# Patient Record
Sex: Female | Born: 1980 | Race: White | Hispanic: No | Marital: Single | Smoking: Never smoker
Health system: Southern US, Community
[De-identification: ages and names within clinical notes are randomized; demographics above are authoritative.]

## PROBLEM LIST (undated history)

## (undated) ENCOUNTER — Ambulatory Visit (HOSPITAL_COMMUNITY): Admission: EM | Payer: Self-pay

## (undated) DIAGNOSIS — N12 Tubulo-interstitial nephritis, not specified as acute or chronic: Secondary | ICD-10-CM

## (undated) DIAGNOSIS — F419 Anxiety disorder, unspecified: Secondary | ICD-10-CM

## (undated) DIAGNOSIS — J45909 Unspecified asthma, uncomplicated: Secondary | ICD-10-CM

## (undated) DIAGNOSIS — D259 Leiomyoma of uterus, unspecified: Secondary | ICD-10-CM

## (undated) HISTORY — DX: Tubulo-interstitial nephritis, not specified as acute or chronic: N12

## (undated) HISTORY — DX: Unspecified asthma, uncomplicated: J45.909

## (undated) HISTORY — DX: Leiomyoma of uterus, unspecified: D25.9

## (undated) HISTORY — DX: Anxiety disorder, unspecified: F41.9

## (undated) HISTORY — PX: WISDOM TOOTH EXTRACTION: SHX21

---

## 2019-04-02 LAB — RESULTS CONSOLE HPV: CHL HPV: NEGATIVE

## 2019-04-02 LAB — HM PAP SMEAR: HM Pap smear: NORMAL

## 2021-07-10 ENCOUNTER — Other Ambulatory Visit: Payer: Self-pay

## 2021-07-14 ENCOUNTER — Emergency Department (HOSPITAL_COMMUNITY)
Admission: EM | Admit: 2021-07-14 | Discharge: 2021-07-14 | Disposition: A | Discharge status: Home / Self Care | Payer: No Typology Code available for payment source | Attending: Emergency Medicine | Admitting: Emergency Medicine

## 2021-07-14 ENCOUNTER — Other Ambulatory Visit: Payer: Self-pay

## 2021-07-14 ENCOUNTER — Emergency Department (HOSPITAL_COMMUNITY): Payer: No Typology Code available for payment source

## 2021-07-14 ENCOUNTER — Encounter (HOSPITAL_COMMUNITY): Payer: Self-pay

## 2021-07-14 DIAGNOSIS — M549 Dorsalgia, unspecified: Secondary | ICD-10-CM | POA: Insufficient documentation

## 2021-07-14 DIAGNOSIS — N12 Tubulo-interstitial nephritis, not specified as acute or chronic: Secondary | ICD-10-CM

## 2021-07-14 DIAGNOSIS — R109 Unspecified abdominal pain: Secondary | ICD-10-CM | POA: Insufficient documentation

## 2021-07-14 DIAGNOSIS — Z20822 Contact with and (suspected) exposure to covid-19: Secondary | ICD-10-CM | POA: Diagnosis not present

## 2021-07-14 DIAGNOSIS — R509 Fever, unspecified: Secondary | ICD-10-CM | POA: Diagnosis not present

## 2021-07-14 LAB — URINALYSIS, ROUTINE W REFLEX MICROSCOPIC
Bilirubin Urine: NEGATIVE
Glucose, UA: NEGATIVE mg/dL
Hgb urine dipstick: NEGATIVE
Ketones, ur: NEGATIVE mg/dL
Leukocytes,Ua: NEGATIVE
Nitrite: NEGATIVE
Protein, ur: NEGATIVE mg/dL
Specific Gravity, Urine: 1.001 — ABNORMAL LOW (ref 1.005–1.030)
pH: 6 (ref 5.0–8.0)

## 2021-07-14 LAB — COMPREHENSIVE METABOLIC PANEL
ALT: 12 U/L (ref 0–44)
AST: 12 U/L — ABNORMAL LOW (ref 15–41)
Albumin: 3.5 g/dL (ref 3.5–5.0)
Alkaline Phosphatase: 52 U/L (ref 38–126)
Anion gap: 8 (ref 5–15)
BUN: 11 mg/dL (ref 6–20)
CO2: 25 mmol/L (ref 22–32)
Calcium: 9.1 mg/dL (ref 8.9–10.3)
Chloride: 102 mmol/L (ref 98–111)
Creatinine, Ser: 0.77 mg/dL (ref 0.44–1.00)
GFR, Estimated: >60 mL/min (ref >60)
Glucose, Bld: 83 mg/dL (ref 70–99)
Potassium: 3.7 mmol/L (ref 3.5–5.1)
Sodium: 135 mmol/L (ref 135–145)
Total Bilirubin: 0.5 mg/dL (ref 0.3–1.2)
Total Protein: 7.5 g/dL (ref 6.5–8.1)

## 2021-07-14 LAB — CBC WITH DIFFERENTIAL/PLATELET
Abs Immature Granulocytes: 0.09 10*3/uL — ABNORMAL HIGH (ref 0.00–0.07)
Basophils Absolute: 0.1 10*3/uL (ref 0.0–0.1)
Basophils Relative: 1 %
Eosinophils Absolute: 0.2 10*3/uL (ref 0.0–0.5)
Eosinophils Relative: 2 %
HCT: 31.6 % — ABNORMAL LOW (ref 36.0–46.0)
Hemoglobin: 9.9 g/dL — ABNORMAL LOW (ref 12.0–15.0)
Immature Granulocytes: 1 %
Lymphocytes Relative: 22 %
Lymphs Abs: 1.8 10*3/uL (ref 0.7–4.0)
MCH: 26.5 pg (ref 26.0–34.0)
MCHC: 31.3 g/dL (ref 30.0–36.0)
MCV: 84.5 fL (ref 80.0–100.0)
Monocytes Absolute: 0.7 10*3/uL (ref 0.1–1.0)
Monocytes Relative: 9 %
Neutro Abs: 5.4 10*3/uL (ref 1.7–7.7)
Neutrophils Relative %: 65 %
Platelets: 300 10*3/uL (ref 150–400)
RBC: 3.74 MIL/uL — ABNORMAL LOW (ref 3.87–5.11)
RDW: 15.7 % — ABNORMAL HIGH (ref 11.5–15.5)
WBC: 8.2 10*3/uL (ref 4.0–10.5)
nRBC: 0 % (ref 0.0–0.2)

## 2021-07-14 LAB — LACTIC ACID, PLASMA
Lactic Acid, Venous: 0.4 mmol/L — ABNORMAL LOW (ref 0.5–1.9)
Lactic Acid, Venous: 0.5 mmol/L (ref 0.5–1.9)

## 2021-07-14 LAB — RESP PANEL BY RT-PCR (FLU A&B, COVID) ARPGX2
Influenza A by PCR: NEGATIVE
Influenza B by PCR: NEGATIVE
SARS Coronavirus 2 by RT PCR: NEGATIVE

## 2021-07-14 LAB — I-STAT BETA HCG BLOOD, ED (MC, WL, AP ONLY): I-stat hCG, quantitative: <5 m[IU]/mL (ref <5)

## 2021-07-14 MED ORDER — CEPHALEXIN 500 MG PO CAPS: 500.0000 mg | ORAL_CAPSULE | Freq: Three times a day (TID) | ORAL | 0 refills | Status: DC

## 2021-07-14 MED ORDER — CEPHALEXIN 500 MG PO CAPS: 500.0000 mg | ORAL_CAPSULE | Freq: Three times a day (TID) | ORAL | 0 refills | Status: AC

## 2021-07-14 MED ORDER — SODIUM CHLORIDE 0.9 % IV SOLN
1.0000 g | Freq: Once | INTRAVENOUS | Status: AC
  Administered 2021-07-14: 1 g via INTRAVENOUS
  Filled 2021-07-14: qty 10

## 2021-07-14 MED ORDER — IOHEXOL 350 MG/ML SOLN
80.0000 mL | Freq: Once | INTRAVENOUS | Status: AC | PRN
  Administered 2021-07-14: 80 mL via INTRAVENOUS

## 2021-07-14 MED ORDER — HYDROCODONE-ACETAMINOPHEN 5-325 MG PO TABS: 1.0000 | ORAL_TABLET | Freq: Four times a day (QID) | ORAL | 0 refills | Status: DC | PRN

## 2021-07-14 MED ORDER — SODIUM CHLORIDE 0.9 % IV BOLUS
1000.0000 mL | Freq: Once | INTRAVENOUS | Status: AC
  Administered 2021-07-14: 1000 mL via INTRAVENOUS

## 2021-07-14 MED ORDER — MORPHINE SULFATE (PF) 4 MG/ML IV SOLN
4.0000 mg | Freq: Once | INTRAVENOUS | Status: AC
  Administered 2021-07-14: 4 mg via INTRAVENOUS
  Filled 2021-07-14: qty 1

## 2021-07-14 NOTE — ED Provider Notes (Signed)
[place] DEPT Provider Note   CSN: [id number] Arrival date & time: 07/14/21  1635     History Chief Complaint  Patient presents with   Fever   Flank Pain   Back Pain    Erica Chavez is a 40 y.o. female here presenting with right flank pain, chills, back pain.  Patient states that she woke up 4 days ago and started having right flank pain.  She states that she also started having fevers.  She states that since then she has been having fever with T-max 101 every day.  She had a home negative COVID test.  She then went to urgent care and had a negative flu and COVID test as well.  She states that she is running persistent fevers that were improved with Excedrin.  She denies any cough.  Denies any sick contacts.  The history is provided by the patient.      History reviewed. No pertinent past medical history.  There are no problems to display for this patient.    OB History   No obstetric history on file.     No family history on file.  Social History   Tobacco Use   Smoking status: Never   Smokeless tobacco: Never  Substance Use Topics   Alcohol use: Yes    Comment: occassional   Drug use: Never    Home Medications Prior to Admission medications   Not on File    Allergies    Patient has no known allergies.  Review of Systems   Review of Systems  Constitutional:  Positive for fever.  Genitourinary:  Positive for flank pain.  Musculoskeletal:  Positive for back pain.  All other systems reviewed and are negative.  Physical Exam Updated Vital Signs BP 116/82   Pulse 76   Temp 97.8 F (36.6 C) (Oral)   Resp 16   Ht 5\' 9"  (1.753 m)   Wt 61.7 kg   LMP 06/25/2021   SpO2 99%   BMI 20.08 kg/m   Physical Exam Vitals and nursing note reviewed.  HENT:     Head: Normocephalic.     Nose: Nose normal.     Mouth/Throat:     Mouth: Mucous membranes are moist.  Eyes:     Extraocular Movements: Extraocular movements  intact.     Pupils: Pupils are equal, round, and reactive to light.  Cardiovascular:     Rate and Rhythm: Normal rate and regular rhythm.     Pulses: Normal pulses.     Heart sounds: Normal heart sounds.  Pulmonary:     Effort: Pulmonary effort is normal.     Breath sounds: Normal breath sounds.  Abdominal:     General: Abdomen is flat.     Comments: Right CVA tenderness  Musculoskeletal:        General: Normal range of motion.     Cervical back: Normal range of motion and neck supple.  Skin:    General: Skin is warm.     Capillary Refill: Capillary refill takes less than 2 seconds.  Neurological:     General: No focal deficit present.     Mental Status: She is alert and oriented to person, place, and time.  Psychiatric:        Mood and Affect: Mood normal.        Behavior: Behavior normal.    ED Results / Procedures / Treatments   Labs (all labs ordered are listed, but only abnormal results  are displayed) Labs Reviewed  COMPREHENSIVE METABOLIC PANEL - Abnormal; Notable for the following components:      Result Value   AST 12 (*)    All other components within normal limits  CBC WITH DIFFERENTIAL/PLATELET - Abnormal; Notable for the following components:   RBC 3.74 (*)    Hemoglobin 9.9 (*)    HCT 31.6 (*)    RDW 15.7 (*)    Abs Immature Granulocytes 0.09 (*)    All other components within normal limits  URINALYSIS, ROUTINE W REFLEX MICROSCOPIC - Abnormal; Notable for the following components:   Color, Urine COLORLESS (*)    Specific Gravity, Urine 1.001 (*)    All other components within normal limits  LACTIC ACID, PLASMA - Abnormal; Notable for the following components:   Lactic Acid, Venous 0.4 (*)    All other components within normal limits  RESP PANEL BY RT-PCR (FLU A&B, COVID) ARPGX2  URINE CULTURE  CULTURE, BLOOD (ROUTINE X 2)  CULTURE, BLOOD (ROUTINE X 2)  LACTIC ACID, PLASMA  I-STAT BETA HCG BLOOD, ED ([facility], [facility], [facility] ONLY)    EKG None  Radiology DG  Chest 2 View  Result Date: 07/14/2021 CLINICAL DATA:  Fever EXAM: CHEST - 2 VIEW COMPARISON:  None. FINDINGS: The heart size and mediastinal contours are within normal limits. Both lungs are clear. The visualized skeletal structures are unremarkable. IMPRESSION: No active cardiopulmonary disease. Electronically Signed   By: Fidela Salisbury M.D.   On: 07/14/2021 19:46   CT ABDOMEN PELVIS W CONTRAST  Result Date: 07/14/2021 CLINICAL DATA:  Abdominal pain, fever, right flank pain EXAM: CT ABDOMEN AND PELVIS WITH CONTRAST TECHNIQUE: Multidetector CT imaging of the abdomen and pelvis was performed using the standard protocol following bolus administration of intravenous contrast. CONTRAST:  76mL OMNIPAQUE IOHEXOL 350 MG/ML SOLN COMPARISON:  None. FINDINGS: Lower chest: Trace bilateral pleural fluid. No acute airspace disease. Hepatobiliary: No focal liver abnormality is seen. No gallstones, gallbladder wall thickening, or biliary dilatation. Pancreas: Unremarkable. No pancreatic ductal dilatation or surrounding inflammatory changes. Spleen: Normal in size without focal abnormality. Adrenals/Urinary Tract: The right kidney is enlarged and edematous, with heterogeneous enhancement compatible with acute pyelonephritis. No evidence of renal abscess. The left kidney enhances normally. 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 The bladder is unremarkable.  The adrenals are normal. Stomach/Bowel: No bowel obstruction or ileus. The appendix, if still present, is not well visualized. No bowel wall thickening or inflammatory change. Vascular/Lymphatic: No significant vascular findings are present. No enlarged abdominal or pelvic lymph nodes. Reproductive: There is a 7.3 x 5.9 by 7.2 cm intramural mass within the left lateral aspect of the lower uterine segment, consistent with a large intramural fibroid. There are no adnexal masses. Other: There is a small amount of free fluid within the lower pelvis,  more than is typically seen for physiologic fluid. This may be reactive Chavez to the inflammatory changes in the right kidney described above. No free intraperitoneal gas. No abdominal wall hernia. Musculoskeletal: No acute or destructive bony lesions. Reconstructed images demonstrate no additional findings. IMPRESSION: 1. Right-sided pyelonephritis.  No evidence of renal abscess. 2. Large intramural fibroid within the lower uterine segment, measuring up to 7.3 cm in size. 3. Small volume free fluid within the lower pelvis, likely reactive. 4. Trace bilateral pleural effusions. Electronically Signed   By: Randa Ngo M.D.   On: 07/14/2021 20:51    Procedures Procedures   Medications Ordered in ED Medications  sodium chloride 0.9 % bolus 1,000 mL (0 mLs Intravenous Stopped 07/14/21 2130)  iohexol (OMNIPAQUE) 350 MG/ML injection 80 mL (80 mLs Intravenous Contrast Given 07/14/21 1942)  cefTRIAXone (ROCEPHIN) 1 g in sodium chloride 0.9 % 100 mL IVPB (0 g Intravenous Stopped 07/14/21 2204)  morphine 4 MG/ML injection 4 mg (4 mg Intravenous Given 07/14/21 2116)    ED Course  I have reviewed the triage vital signs and the nursing notes.  Pertinent labs & imaging results that were available during my care of the patient were reviewed by me and considered in my medical decision making (see chart for details).    MDM Rules/Calculators/A&P                           Erica Chavez is a 40 y.o. female here presenting with fevers for 4 days.  He also has right flank pain as well.  Consider infected kidney stone versus pyelonephritis versus pneumonia.  We will get CBC and CMP and lactate and CT abdomen pelvis and chest x-ray and repeat COVID and flu test.   10:09 PM White blood cell count is normal.  Lactate is normal.  Patient does have right-sided pyelonephritis.  There is no evidence of renal abscess.  She is not septic right now and her urinalysis is normal. She asked me about why she has  pyelonephritis.  I told her that it may be from her fibroid.  There is no obvious obstructing stone.  We will give a dose of Rocephin and discharged home with Keflex.  Told her that she may benefit from seeing urology  Final Clinical Impression(s) / ED Diagnoses Final diagnoses:  None    Rx / DC Orders ED Discharge Orders     None        Drenda Freeze, MD 07/14/21 2211

## 2021-07-14 NOTE — ED Provider Notes (Signed)
Emergency Medicine Provider Triage Evaluation Note  Erica Chavez , a 40 y.o. female  was evaluated in triage.  Pt complains of fevers, chills, body aches and right sided flank pain since Tuesday.    No cough or headache.  No sore throat.  No urinary symptoms. No discharge, no prior abdominal surgeries. On Monday she vomited, and felt better until Tuesday PM.  She didn't have any diarrhea.  She hasn't had any coffee during this time.  She has had temps up to 101.3.    Review of Systems  Positive: Right sided flank pain, fevers Negative: Dysuria, frequency or urgency.   Physical Exam  BP 103/65 (BP Location: Left Arm)   Pulse 73   Temp 97.8 F (36.6 C) (Oral)   Resp 19   Ht 5\' 9"  (1.753 m)   Wt 61.7 kg   SpO2 99%   BMI 20.08 kg/m  Gen:   Awake, no distress   Resp:  Normal effort  MSK:   Moves extremities without difficulty  Other:  ABD is not tender, right sided CVA tenderness to percussion.   Medical Decision Making  Medically screening exam initiated at 4:53 PM.  Appropriate orders placed.  Jisell Majer was informed that the remainder of the evaluation will be completed by another provider, this initial triage assessment does not replace that evaluation, and the importance of remaining in the ED until their evaluation is complete.      Lorin Glass, PA-C 07/14/21 1709    Carmin Muskrat, MD 07/14/21 2102

## 2021-07-14 NOTE — ED Triage Notes (Signed)
Pt c/o fever, chills, and body aches since Tuesday. Pt was seen at urgent care and tested negative for the flu. Pt was sent here for further workup due to pain in her right lower back that radiates to the front.

## 2021-07-14 NOTE — Discharge Instructions (Addendum)
You have a kidney infection.  Take Keflex 3 times daily for 10 days   Take tylenol, motrin for fever. Take vicodin for severe pain   You may benefit from seeing a urology  Return to ER if you have persistent fever, flank pain, vomiting

## 2021-07-16 LAB — URINE CULTURE: Culture: 40000 — AB

## 2021-07-17 ENCOUNTER — Telehealth: Payer: Self-pay | Admitting: Emergency Medicine

## 2021-07-17 NOTE — Telephone Encounter (Signed)
Post ED Visit - Positive Culture Follow-up  Culture report reviewed by antimicrobial stewardship pharmacist: [facility] Team []  Elenor Quinones, Pharm.D. [x]  Heide Guile, Pharm.D., BCPS AQ-ID []  Parks Neptune, Pharm.D., BCPS []  Alycia Rossetti, Pharm.D., BCPS []  [place], Pharm.D., BCPS, AAHIVP []  Legrand Como, Pharm.D., BCPS, AAHIVP []  Salome Arnt, PharmD, BCPS []  Johnnette Gourd, PharmD, BCPS []  Hughes Better, PharmD, BCPS []  Leeroy Cha, PharmD []  Laqueta Linden, PharmD, BCPS []  Albertina Parr, PharmD  [place] Team []  Leodis Sias, PharmD []  Lindell Spar, PharmD []  Royetta Asal, PharmD []  Graylin Shiver, Rph []  Rema Fendt) Glennon Mac, PharmD []  Arlyn Dunning, PharmD []  Netta Cedars, PharmD []  Dia Sitter, PharmD []  Leone Haven, PharmD []  Gretta Arab, PharmD []  Theodis Shove, PharmD []  Peggyann Juba, PharmD []  Reuel Boom, PharmD   Positive urine culture Treated with cephalexin, organism sensitive to the same and no further patient follow-up is required at this time.  Hazle Nordmann 07/17/2021, 9:28 AM

## 2021-07-20 LAB — CULTURE, BLOOD (ROUTINE X 2)
Culture: NO GROWTH
Culture: NO GROWTH

## 2022-04-04 ENCOUNTER — Encounter: Payer: Self-pay | Admitting: Nurse Practitioner

## 2022-04-04 ENCOUNTER — Ambulatory Visit (INDEPENDENT_AMBULATORY_CARE_PROVIDER_SITE_OTHER): Payer: 59 | Admitting: Nurse Practitioner

## 2022-04-04 VITALS — BP 108/74 | HR 83 | Temp 97.1°F | Wt 123.0 lb

## 2022-04-04 DIAGNOSIS — D259 Leiomyoma of uterus, unspecified: Secondary | ICD-10-CM

## 2022-04-04 DIAGNOSIS — Z87448 Personal history of other diseases of urinary system: Secondary | ICD-10-CM | POA: Diagnosis not present

## 2022-04-04 DIAGNOSIS — Z1283 Encounter for screening for malignant neoplasm of skin: Secondary | ICD-10-CM

## 2022-04-04 DIAGNOSIS — J4520 Mild intermittent asthma, uncomplicated: Secondary | ICD-10-CM | POA: Diagnosis not present

## 2022-04-04 DIAGNOSIS — F418 Other specified anxiety disorders: Secondary | ICD-10-CM | POA: Diagnosis not present

## 2022-04-04 MED ORDER — ALBUTEROL SULFATE HFA 108 (90 BASE) MCG/ACT IN AERS: 2.0000 | INHALATION_SPRAY | Freq: Four times a day (QID) | RESPIRATORY_TRACT | 6 refills | Status: AC | PRN

## 2022-04-04 MED ORDER — ALPRAZOLAM 0.25 MG PO TABS: 0.2500 mg | ORAL_TABLET | Freq: Two times a day (BID) | ORAL | 0 refills | Status: AC | PRN

## 2022-04-04 NOTE — Progress Notes (Signed)
New Patient Visit  BP 108/74   Pulse 83   Temp (!) 97.1 F (36.2 C) (Temporal)   Wt 123 lb (55.8 kg)   SpO2 99%   BMI 18.16 kg/m    Subjective:    Patient ID: Erica Chavez, female    DOB: March 04, 1981, 41 y.o.   MRN: [id number]  CC: Chief Complaint  Patient presents with   Establish Care    Np. Est care. Overall health assessment. Pt states she is experiencing high levels of stress due to work.     HPI: Erica Chavez is a 41 y.o. female presents for new patient visit to establish care.  Introduced to Designer, jewellery role and practice setting.  All questions answered.  Discussed provider/patient relationship and expectations.  Chinara has a recent history of pyelonephritis.  She states that she did not have any urinary tract symptoms before she developed severe flank pain.  She states that she needs to drink a lot of water frequently reports she will feel a tugging in her vaginal area.  She was told to follow-up with urology she has not done this, however she would like a referral today.  Currently she is not having any urinary symptoms including dysuria, urinary frequency, urgency.  She has a history of intermittent asthma she uses an albuterol inhaler as needed.  She states that she does not need to use this frequently her symptoms are overall well controlled.  She states that she has been under a lot of stress recently along with situational anxiety.  She has tried using the calm app, the better help app, and doing meditation.  She states that she has lost weight recently without trying.  She has noticed tingling in her hands and her fingers that will radiate up her arms.  This is typically a sign of an upcoming panic attack.  She is interested in starting therapy and possibly medication to take as needed.  She denies SI/HI.  She has taken Tylenol PM to help her sleep at home.     04/04/2022   12:01 PM  Depression screen PHQ 2/9  Down, Depressed, Hopeless 2  PHQ - 2 Score  2  Altered sleeping 3  Tired, decreased energy 2  Change in appetite 2  Feeling bad or failure about yourself  2  Trouble concentrating 2  Moving slowly or fidgety/restless 3  Suicidal thoughts 0  PHQ-9 Score 16  Difficult doing work/chores Very difficult      04/04/2022   12:01 PM  GAD 7 : Generalized Anxiety Score  Nervous, Anxious, on Edge 3  Control/stop worrying 3  Worry too much - different things 3  Trouble relaxing 3  Restless 3  Easily annoyed or irritable 3  Afraid - awful might happen 2  Total GAD 7 Score 20  Anxiety Difficulty Very difficult    Past Medical History:  Diagnosis Date   Anxiety    Asthma    Pyelonephritis    Uterine fibroid     Past Surgical History:  Procedure Laterality Date   WISDOM TOOTH EXTRACTION      Family History  Problem Relation Age of Onset   Cancer Maternal Aunt        ovarian   Cancer Maternal Grandmother        lung   Kidney disease Paternal Grandfather      Social History   Tobacco Use   Smoking status: Never   Smokeless tobacco: Never  Vaping Use   Vaping  Use: Never used  Substance Use Topics   Alcohol use: Yes    Comment: occassional   Drug use: Never    No current outpatient medications on file prior to visit.   No current facility-administered medications on file prior to visit.     Review of Systems  Constitutional:  Positive for fatigue. Negative for fever.  HENT: Negative.    Respiratory: Negative.    Cardiovascular: Negative.   Gastrointestinal: Negative.   Genitourinary: Negative.   Musculoskeletal: Negative.   Neurological: Negative.   Psychiatric/Behavioral:  Positive for sleep disturbance. The patient is nervous/anxious.         Objective:    BP 108/74   Pulse 83   Temp (!) 97.1 F (36.2 C) (Temporal)   Wt 123 lb (55.8 kg)   SpO2 99%   BMI 18.16 kg/m   Wt Readings from Last 3 Encounters:  04/04/22 123 lb (55.8 kg)  07/14/21 136 lb (61.7 kg)    BP Readings from Last 3  Encounters:  04/04/22 108/74  07/14/21 116/82    Physical Exam Vitals and nursing note reviewed.  Constitutional:      General: She is not in acute distress.    Appearance: Normal appearance.  HENT:     Head: Normocephalic.  Eyes:     Conjunctiva/sclera: Conjunctivae normal.  Cardiovascular:     Rate and Rhythm: Normal rate and regular rhythm.     Pulses: Normal pulses.     Heart sounds: Normal heart sounds.  Pulmonary:     Effort: Pulmonary effort is normal.     Breath sounds: Normal breath sounds.  Abdominal:     Palpations: Abdomen is soft.     Tenderness: There is no abdominal tenderness.  Musculoskeletal:     Cervical back: Normal range of motion and neck supple. No tenderness.  Lymphadenopathy:     Cervical: No cervical adenopathy.  Skin:    General: Skin is warm.  Neurological:     General: No focal deficit present.     Mental Status: She is alert and oriented to person, place, and time.  Psychiatric:        Mood and Affect: Mood normal.        Behavior: Behavior normal.        Thought Content: Thought content normal.        Judgment: Judgment normal.        Assessment & Plan:   Problem List Items Addressed This Visit       Respiratory   Mild intermittent asthma without complication    She has a history of mild intermittent asthma which is well controlled.  We will refill her albuterol inhaler to use as needed.  Follow-up if she is needing to use her inhaler more frequently.      Relevant Medications   albuterol (VENTOLIN HFA) 108 (90 Base) MCG/ACT inhaler     Genitourinary   Uterine leiomyoma    She had a recent CT of her abdomen and pelvis done which showed a intramural uterine fibroid that was 7.3 x 5.9 x 7.2 cm.  Referral placed to GYN.  She does endorse intermittent menstrual periods that are heavy in flow.        Other   Situational anxiety - Primary    She has been having an increased amount of stress at work which causes her to have trouble  sleeping.  She has been using various apps to help with her anxiety which helps a little  bit, however it is still ongoing.  Her PHQ-9 is a 16 and her GAD-7 is a 20.  She denies SI/HI.  We will place a referral for therapy and start Xanax 0.25 mg twice a day as needed.  Discussed possible side effects and told her not to take this and drive.  PDMP reviewed.  Follow-up in 2 to 3 months.      Relevant Medications   ALPRAZolam (XANAX) 0.25 MG tablet   Other Relevant Orders   Ambulatory referral to Psychology   Other Visit Diagnoses     History of pyelonephritis       She has a history of pyelonephritis and was told to follow-up with urology.  Referral to urology placed.   Relevant Orders   Ambulatory referral to Urology   Screening exam for skin cancer       Referral placed to dermatology per patient request.   Relevant Orders   Ambulatory referral to Dermatology        Follow up plan: Return in about 4 weeks (around 05/02/2022) for 1-2 months, CPE.

## 2022-04-04 NOTE — Assessment & Plan Note (Signed)
She had a recent CT of her abdomen and pelvis done which showed a intramural uterine fibroid that was 7.3 x 5.9 x 7.2 cm.  Referral placed to GYN.  She does endorse intermittent menstrual periods that are heavy in flow.

## 2022-04-04 NOTE — Assessment & Plan Note (Signed)
She has been having an increased amount of stress at work which causes her to have trouble sleeping.  She has been using various apps to help with her anxiety which helps a little bit, however it is still ongoing.  Her PHQ-9 is a 16 and her GAD-7 is a 20.  She denies SI/HI.  We will place a referral for therapy and start Xanax 0.25 mg twice a day as needed.  Discussed possible side effects and told her not to take this and drive.  PDMP reviewed.  Follow-up in 2 to 3 months.

## 2022-04-04 NOTE — Patient Instructions (Signed)
It was great to see you!  You can start xanax as needed for panic attack. This may make you sleepy, do not drive.   I have placed a referral to urology, GYN, and dermatology.   Let's follow-up in 1-2 months, sooner if you have concerns.  If a referral was placed today, you will be contacted for an appointment. Please note that routine referrals can sometimes take up to 3-4 weeks to process. Please call our office if you haven't heard anything after this time frame.  Take care,  Vance Peper, NP

## 2022-04-04 NOTE — Assessment & Plan Note (Signed)
She has a history of mild intermittent asthma which is well controlled.  We will refill her albuterol inhaler to use as needed.  Follow-up if she is needing to use her inhaler more frequently.

## 2022-04-18 ENCOUNTER — Other Ambulatory Visit: Payer: Self-pay | Admitting: Nurse Practitioner

## 2022-04-18 DIAGNOSIS — D259 Leiomyoma of uterus, unspecified: Secondary | ICD-10-CM

## 2022-05-08 ENCOUNTER — Encounter: Payer: Self-pay | Admitting: Nurse Practitioner

## 2022-05-27 ENCOUNTER — Encounter: Payer: No Typology Code available for payment source | Admitting: Family Medicine

## 2023-08-06 IMAGING — CR DG CHEST 2V
2 series · 2 of 2 positions shown · non-contrast
Comparison: None.

CLINICAL DATA: Fever

EXAM:
CHEST - 2 VIEW

[w chest pa]
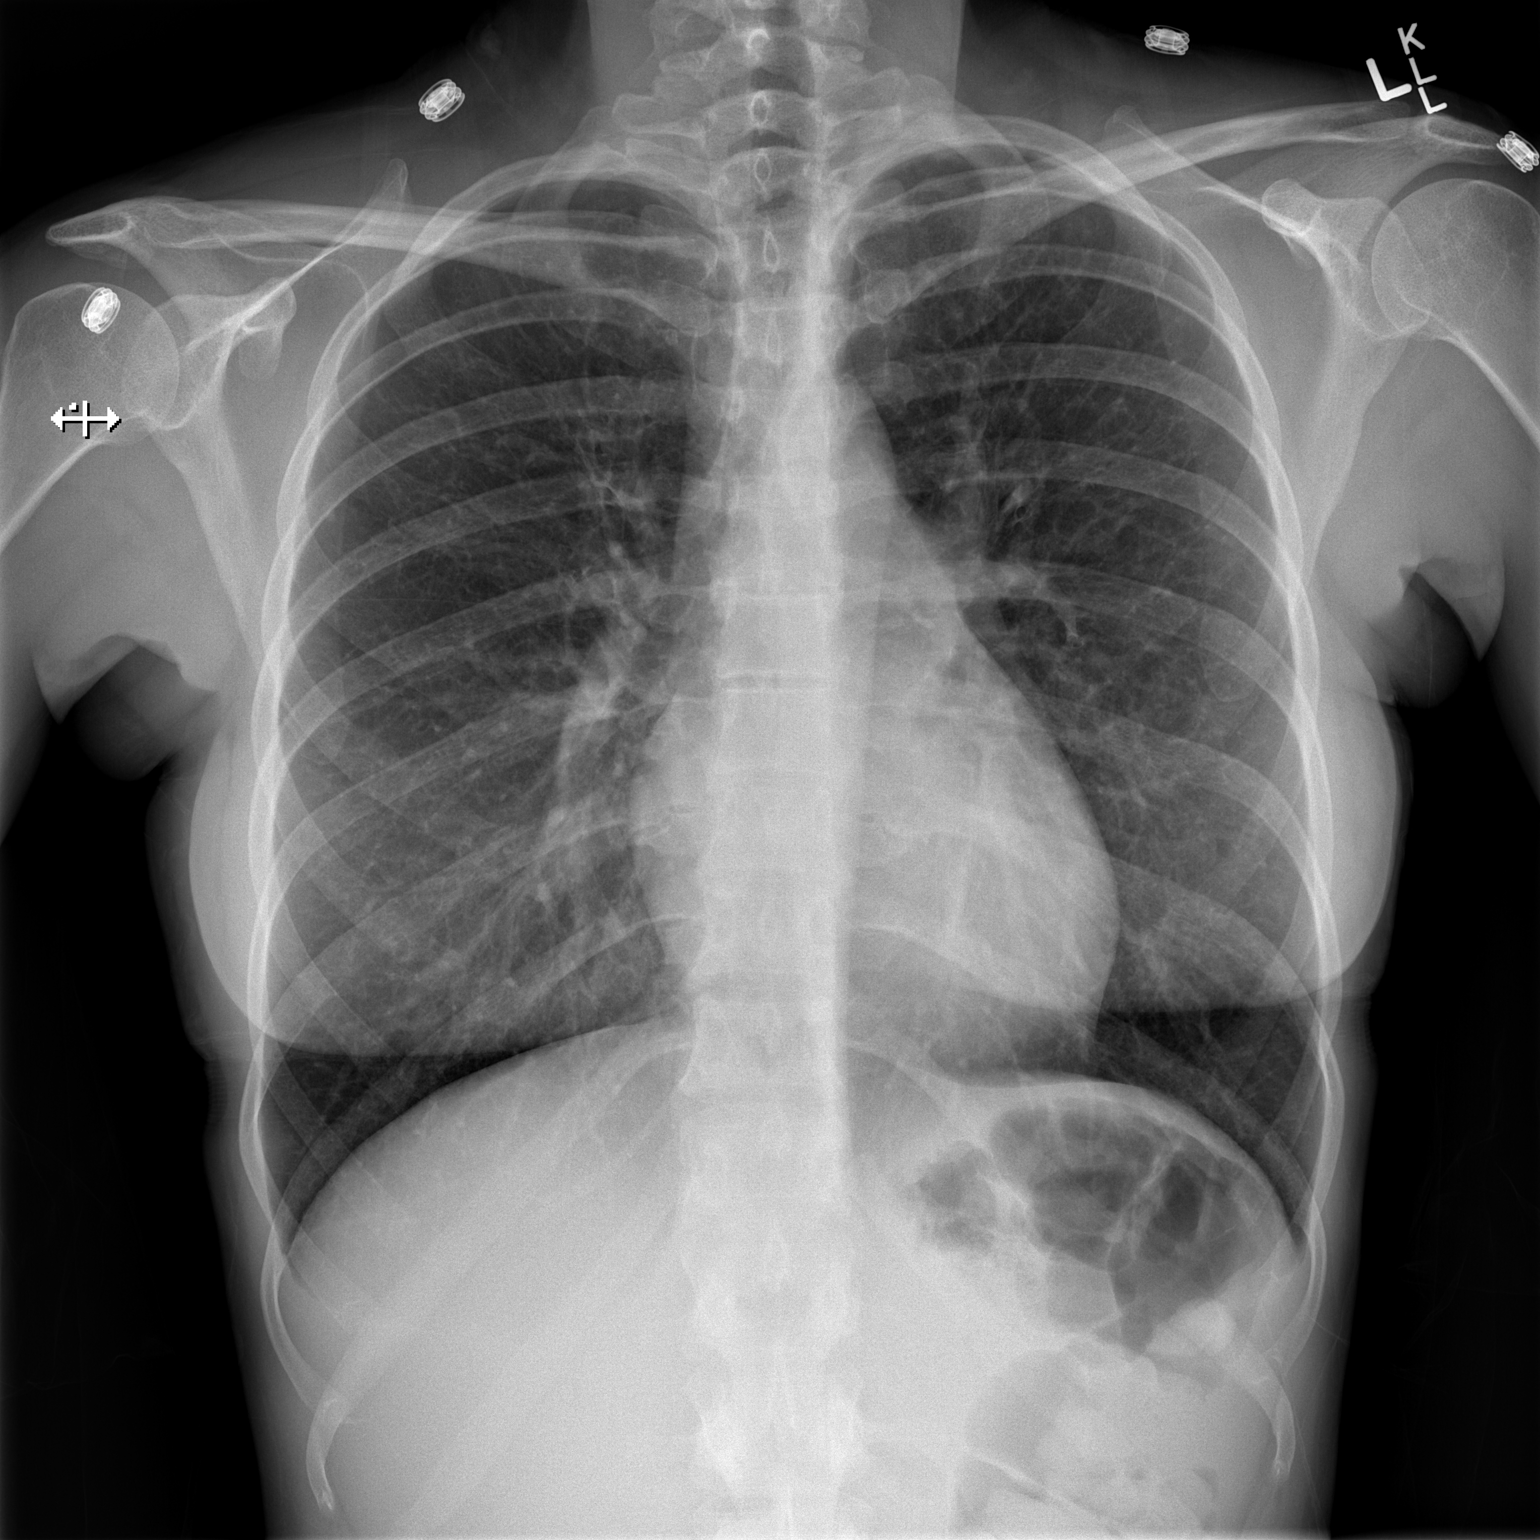

[w chest lat]
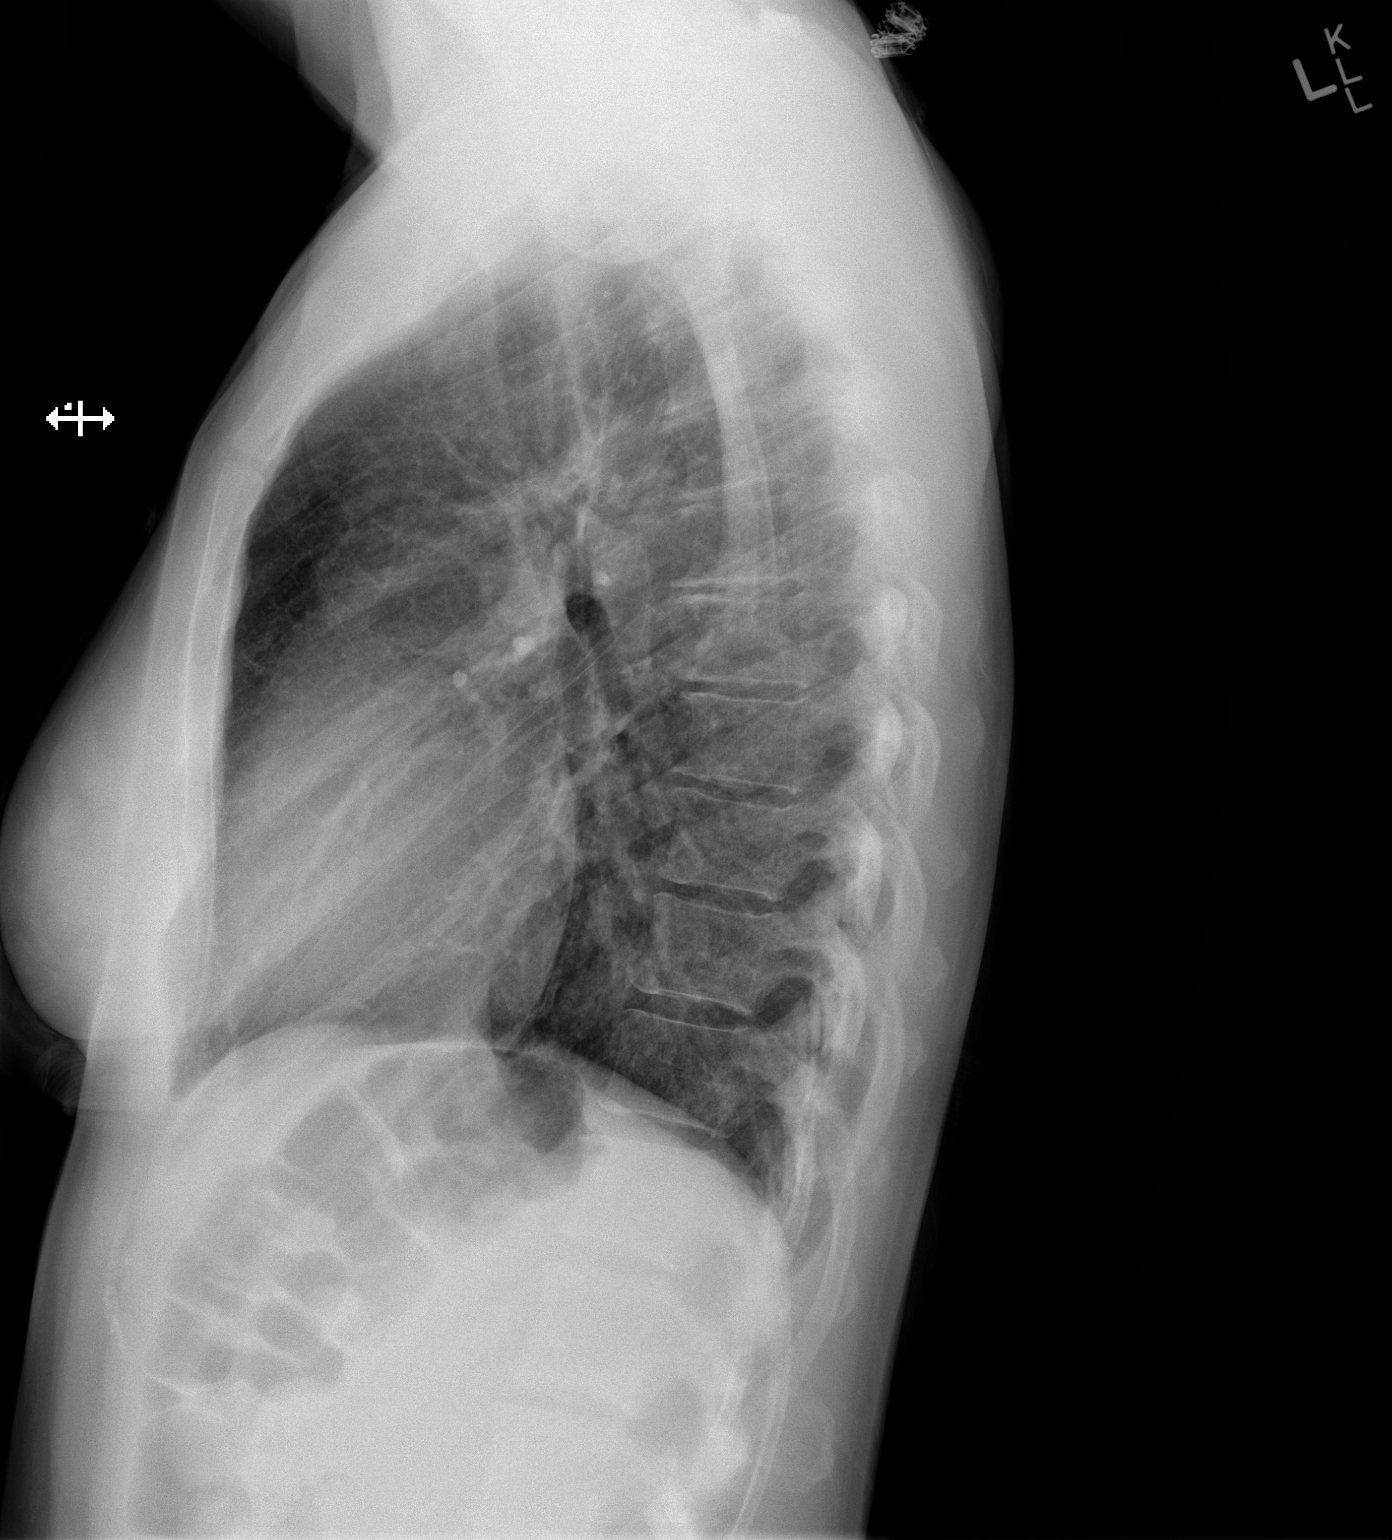

[2 of 2 positions shown; findings below may reference images not displayed]

FINDINGS: The heart size and mediastinal contours are within normal limits.
Both lungs are clear. The visualized skeletal structures are
unremarkable.
IMPRESSION: No active cardiopulmonary disease.

## 2023-11-24 DIAGNOSIS — F4320 Adjustment disorder, unspecified: Secondary | ICD-10-CM | POA: Diagnosis not present

## 2024-01-08 DIAGNOSIS — L709 Acne, unspecified: Secondary | ICD-10-CM | POA: Diagnosis not present

## 2024-01-08 DIAGNOSIS — D251 Intramural leiomyoma of uterus: Secondary | ICD-10-CM | POA: Diagnosis not present

## 2024-01-08 DIAGNOSIS — Z79899 Other long term (current) drug therapy: Secondary | ICD-10-CM | POA: Diagnosis not present

## 2024-01-08 DIAGNOSIS — Z0000 Encounter for general adult medical examination without abnormal findings: Secondary | ICD-10-CM | POA: Diagnosis not present

## 2024-01-08 DIAGNOSIS — Z13220 Encounter for screening for lipoid disorders: Secondary | ICD-10-CM | POA: Diagnosis not present

## 2024-01-21 DIAGNOSIS — N920 Excessive and frequent menstruation with regular cycle: Secondary | ICD-10-CM | POA: Diagnosis not present

## 2024-01-21 DIAGNOSIS — N946 Dysmenorrhea, unspecified: Secondary | ICD-10-CM | POA: Diagnosis not present

## 2024-01-21 DIAGNOSIS — Z5320 Procedure and treatment not carried out because of patient's decision for unspecified reasons: Secondary | ICD-10-CM | POA: Diagnosis not present

## 2024-01-21 DIAGNOSIS — Z01419 Encounter for gynecological examination (general) (routine) without abnormal findings: Secondary | ICD-10-CM | POA: Diagnosis not present

## 2024-01-21 DIAGNOSIS — D251 Intramural leiomyoma of uterus: Secondary | ICD-10-CM | POA: Diagnosis not present

## 2024-01-21 DIAGNOSIS — Z124 Encounter for screening for malignant neoplasm of cervix: Secondary | ICD-10-CM | POA: Diagnosis not present

## 2024-01-21 DIAGNOSIS — Z113 Encounter for screening for infections with a predominantly sexual mode of transmission: Secondary | ICD-10-CM | POA: Diagnosis not present

## 2024-01-21 DIAGNOSIS — D500 Iron deficiency anemia secondary to blood loss (chronic): Secondary | ICD-10-CM | POA: Diagnosis not present

## 2024-05-02 ENCOUNTER — Encounter: Payer: Self-pay | Admitting: Nurse Practitioner

## 2024-08-11 ENCOUNTER — Other Ambulatory Visit: Payer: Self-pay | Admitting: Nurse Practitioner

## 2024-08-11 DIAGNOSIS — N920 Excessive and frequent menstruation with regular cycle: Secondary | ICD-10-CM | POA: Diagnosis not present

## 2024-08-11 DIAGNOSIS — D259 Leiomyoma of uterus, unspecified: Secondary | ICD-10-CM
